# Patient Record
Sex: Female | Born: 1955 | Race: Black or African American | Hispanic: No | Marital: Married | Smoking: Current every day smoker
Health system: Southern US, Community
[De-identification: ages and names within clinical notes are randomized; demographics above are authoritative.]

## PROBLEM LIST (undated history)

## (undated) DIAGNOSIS — E119 Type 2 diabetes mellitus without complications: Secondary | ICD-10-CM

## (undated) DIAGNOSIS — M353 Polymyalgia rheumatica: Secondary | ICD-10-CM

## (undated) DIAGNOSIS — I10 Essential (primary) hypertension: Secondary | ICD-10-CM

## (undated) DIAGNOSIS — E7800 Pure hypercholesterolemia, unspecified: Secondary | ICD-10-CM

## (undated) HISTORY — PX: THYROIDECTOMY, PARTIAL: SHX18

---

## 2017-03-17 ENCOUNTER — Emergency Department: Payer: Self-pay

## 2017-03-17 ENCOUNTER — Encounter: Payer: Self-pay | Admitting: Emergency Medicine

## 2017-03-17 ENCOUNTER — Emergency Department
Admission: EM | Admit: 2017-03-17 | Discharge: 2017-03-17 | Disposition: A | Discharge status: Home / Self Care | Payer: Self-pay | Attending: Emergency Medicine | Admitting: Emergency Medicine

## 2017-03-17 ENCOUNTER — Other Ambulatory Visit: Payer: Self-pay

## 2017-03-17 DIAGNOSIS — R079 Chest pain, unspecified: Secondary | ICD-10-CM | POA: Insufficient documentation

## 2017-03-17 DIAGNOSIS — Z5321 Procedure and treatment not carried out due to patient leaving prior to being seen by health care provider: Secondary | ICD-10-CM | POA: Insufficient documentation

## 2017-03-17 HISTORY — DX: Essential (primary) hypertension: I10

## 2017-03-17 HISTORY — DX: Polymyalgia rheumatica: M35.3

## 2017-03-17 HISTORY — DX: Pure hypercholesterolemia, unspecified: E78.00

## 2017-03-17 HISTORY — DX: Type 2 diabetes mellitus without complications: E11.9

## 2017-03-17 LAB — BASIC METABOLIC PANEL
Anion gap: 10 (ref 5–15)
BUN: 18 mg/dL (ref 6–20)
CALCIUM: 8.9 mg/dL (ref 8.9–10.3)
CHLORIDE: 103 mmol/L (ref 101–111)
CO2: 25 mmol/L (ref 22–32)
CREATININE: 0.77 mg/dL (ref 0.44–1.00)
GFR calc non Af Amer: >60 mL/min (ref >60)
GLUCOSE: 183 mg/dL — AB (ref 65–99)
Potassium: 3.2 mmol/L — ABNORMAL LOW (ref 3.5–5.1)
Sodium: 138 mmol/L (ref 135–145)

## 2017-03-17 LAB — CBC
HCT: 40.5 % (ref 35.0–47.0)
HEMOGLOBIN: 13.6 g/dL (ref 12.0–16.0)
MCH: 31.3 pg (ref 26.0–34.0)
MCHC: 33.5 g/dL (ref 32.0–36.0)
MCV: 93.2 fL (ref 80.0–100.0)
PLATELETS: 381 10*3/uL (ref 150–440)
RBC: 4.34 MIL/uL (ref 3.80–5.20)
RDW: 15.4 % — ABNORMAL HIGH (ref 11.5–14.5)
WBC: 14.8 10*3/uL — ABNORMAL HIGH (ref 3.6–11.0)

## 2017-03-17 LAB — TROPONIN I: Troponin I: <0.03 ng/mL (ref <0.03)

## 2017-03-17 NOTE — ED Triage Notes (Signed)
Pt c/o intermittent right sided sharp chest pains that started today. Some intermittent SHOB as well. No cough. No fevers. No vomiting. No respiratory distress noted.

## 2017-03-21 ENCOUNTER — Telehealth: Payer: Self-pay | Admitting: Emergency Medicine

## 2017-03-21 NOTE — Telephone Encounter (Signed)
Called patient due to lwot to inquire about condition and follow up plans. Left message.   

## 2018-07-02 IMAGING — CR DG CHEST 2V
2 series · 2 of 2 positions shown · non-contrast
Comparison: None.

CLINICAL DATA: Intermittent right-sided chest pain. History of
hypertension and diabetes.

EXAM:
CHEST  2 VIEW

[chest pa]
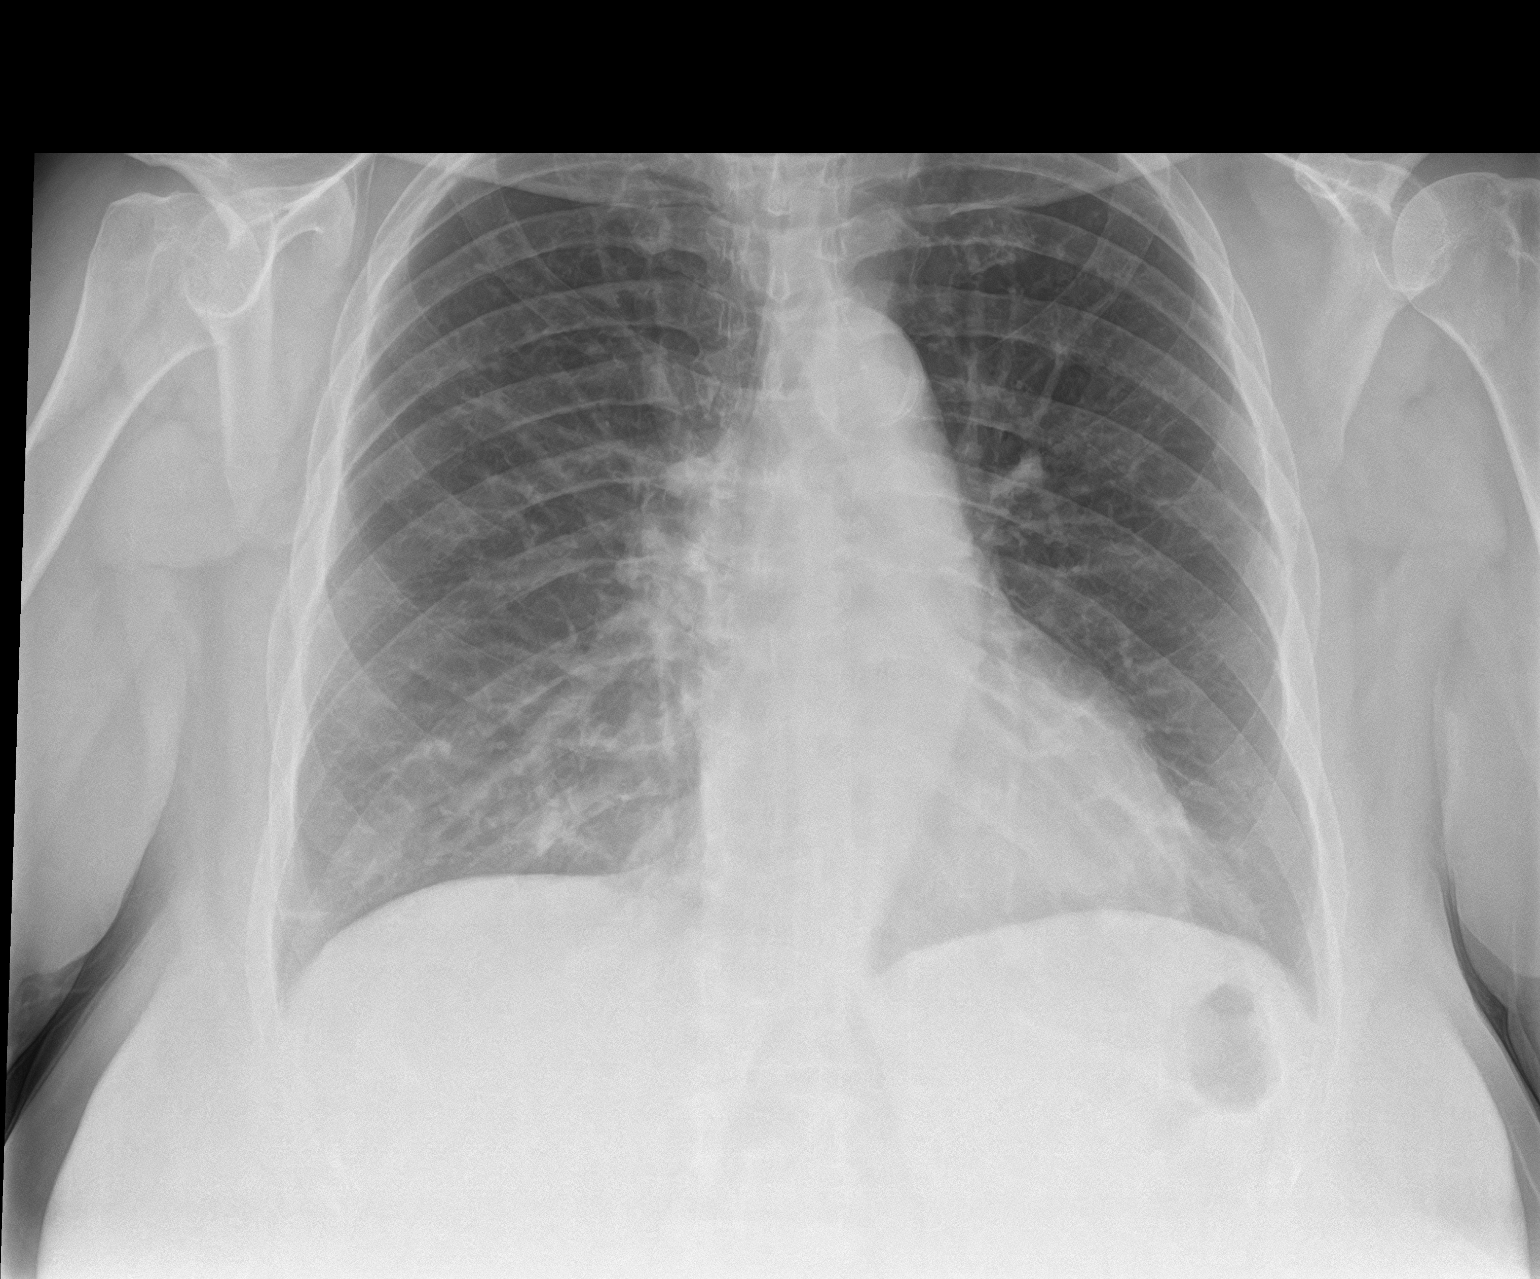

[chest lat]
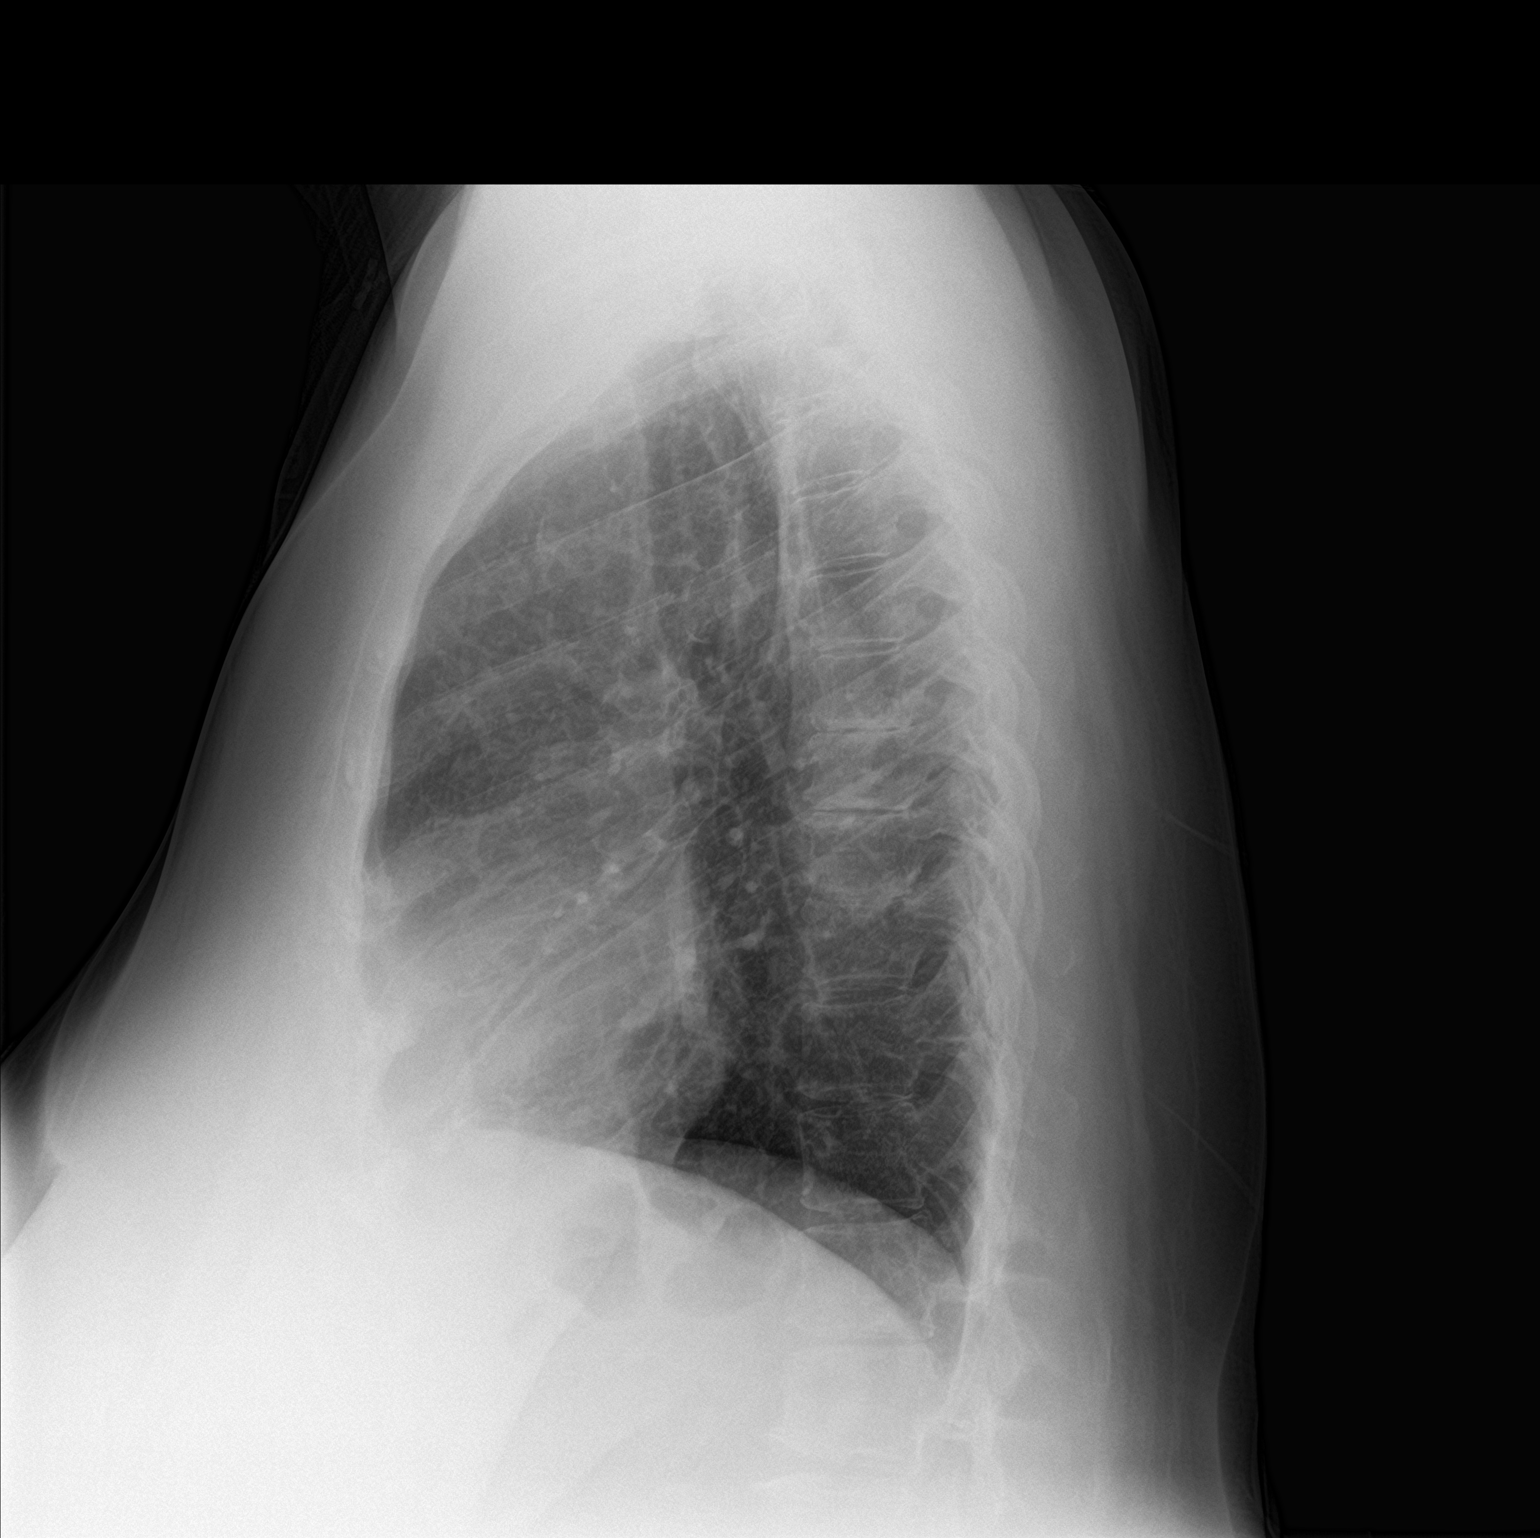

[2 of 2 positions shown; findings below may reference images not displayed]

FINDINGS: The heart size and mediastinal contours are normal. There is mild
aortic atherosclerosis. The lungs are clear. There is no pleural
effusion or pneumothorax. No acute osseous findings are evident.
There are mild degenerative changes throughout the thoracic spine.
IMPRESSION: No active cardiopulmonary process.  Aortic atherosclerosis.

## 2021-08-14 ENCOUNTER — Encounter: Payer: Self-pay | Admitting: Podiatry

## 2021-08-14 ENCOUNTER — Ambulatory Visit (INDEPENDENT_AMBULATORY_CARE_PROVIDER_SITE_OTHER): Payer: Medicare Other | Admitting: Podiatry

## 2021-08-14 ENCOUNTER — Ambulatory Visit (INDEPENDENT_AMBULATORY_CARE_PROVIDER_SITE_OTHER): Payer: Medicare Other

## 2021-08-14 DIAGNOSIS — E0843 Diabetes mellitus due to underlying condition with diabetic autonomic (poly)neuropathy: Secondary | ICD-10-CM

## 2021-08-14 DIAGNOSIS — M2142 Flat foot [pes planus] (acquired), left foot: Secondary | ICD-10-CM

## 2021-08-14 DIAGNOSIS — M7752 Other enthesopathy of left foot: Secondary | ICD-10-CM

## 2021-08-14 DIAGNOSIS — M2141 Flat foot [pes planus] (acquired), right foot: Secondary | ICD-10-CM

## 2021-08-14 DIAGNOSIS — M779 Enthesopathy, unspecified: Secondary | ICD-10-CM

## 2021-08-14 MED ORDER — BETAMETHASONE SOD PHOS & ACET 6 (3-3) MG/ML IJ SUSP: 3.0000 mg | Freq: Once | INTRAMUSCULAR | Status: AC

## 2021-08-14 NOTE — Progress Notes (Signed)
   Subjective:  66 y.o. female presenting today for evaluation of left foot and ankle pain has been going on for about 1-2 years now.  Progressive onset.  Patient states that she has began developing increased pain and tenderness to the left foot and ankle.  She is a diabetic on insulin.  She presents for further treatment and evaluation   Past Medical History:  Diagnosis Date   Diabetes mellitus without complication (HCC)    Hypercholesteremia    Hypertension    PMR (polymyalgia rheumatica) (HCC)    Past Surgical History:  Procedure Laterality Date   THYROIDECTOMY, PARTIAL     No Known Allergies     Objective/Physical Exam General: The patient is alert and oriented x3 in no acute distress.  Dermatology: Skin is warm, dry and supple bilateral lower extremities. Negative for open lesions or macerations.  Vascular: Palpable pedal pulses bilaterally. No edema or erythema noted. Capillary refill within normal limits.  Neurological: Epicritic and protective threshold diminished bilaterally.   Musculoskeletal Exam: Range of motion within normal limits to all pedal and ankle joints bilateral. Muscle strength 5/5 in all groups bilateral.  Upon weightbearing there is a medial longitudinal arch collapse bilaterally. Remove foot valgus noted to the bilateral lower extremities with excessive pronation upon mid stance.  There is also some pain on palpation to the lateral aspect of the left ankle joint  Radiographic Exam:  Degenerative changes noted throughout the joints of the foot and ankle. Joint spaces preserved. No fracture/dislocation/boney destruction.   Pes planus noted on radiographic exam lateral views. Decreased calcaneal inclination and metatarsal declination angle is noted. Anterior break in the cyma line noted on lateral views. Medial talar head to deviation noted on AP radiograph.   Assessment: 1.  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  Capsulitis left ankle   Plan of Care:  1. Patient was evaluated. X-Rays reviewed.  Comprehensive diabetic foot exam performed today 2.  Injection of 0.5 cc Celestone Soluspan injected in the lateral aspect of the left ankle joint.  Patient felt significant relief 3.  Appointment with Pedorthist for custom molded diabetic insoles and shoes 4.  Return to clinic annually   Felecia Shelling, DPM [facility]  Dr. Felecia Shelling, DPM    [address]                                        [place], [place] [address]                Office [phone]  Fax [phone]

## 2021-08-28 ENCOUNTER — Ambulatory Visit: Payer: Medicare Other

## 2021-08-28 DIAGNOSIS — M2141 Flat foot [pes planus] (acquired), right foot: Secondary | ICD-10-CM

## 2021-08-28 DIAGNOSIS — E0843 Diabetes mellitus due to underlying condition with diabetic autonomic (poly)neuropathy: Secondary | ICD-10-CM

## 2021-08-28 NOTE — Progress Notes (Signed)
SITUATION Reason for Consult: Evaluation for Prefabricated Diabetic Shoes and Custom Diabetic Inserts. Patient / Caregiver Report: Patient would like well fitting shoes  OBJECTIVE DATA: Patient History / Diagnosis:    ICD-10-CM   1. Diabetes mellitus due to underlying condition with diabetic autonomic neuropathy, unspecified whether long term insulin use (HCC)  E08.43     2. Pes planus of both feet  M21.41    M21.42       Physician Treating Diabetes:  Cristal Ford, MD  Current or Previous Devices:   None and no history  In-Person Foot Examination: Ulcers & Callousing:   None Deformities:    Pes Planus Sensation:    Intact  Shoe Size:     9W  ORTHOTIC RECOMMENDATION Recommended Devices: - 1x pair prefabricated PDAC approved diabetic shoes; Patient Selected Apex V952W velcro white Size 9W - 3x pair custom-to-patient PDAC approved vacuum formed diabetic insoles.  GOALS OF SHOES AND INSOLES - Reduce shear and pressure - Reduce / Prevent callus formation - Reduce / Prevent ulceration - Protect the fragile healing compromised diabetic foot.  Patient would benefit from diabetic shoes and inserts as patient has diabetes mellitus and the patient has one or more of the following conditions: - History of partial or complete amputation of the foot - History of previous foot ulceration. - History of pre-ulcerative callus - Peripheral neuropathy with evidence of callus formation - Foot deformity - Poor circulation  ACTIONS PERFORMED Potential out of pocket cost was communicated to patient. Patient understood and consented to measurement and casting. Patient was casted for insoles via crush box and measured for shoes via brannock device. Procedure was explained and patient tolerated procedure well. All questions were answered and concerns addressed. Casts were shipped to central fabrication for HOLD until Certificate of Medical Necessity or otherwise necessary authorization from  insurance is obtained.  PLAN Shoes are to be ordered and casts released from hold once all appropriate paperwork is complete. Patient is to be contacted and scheduled for fitting once shoes and insoles have been fabricated and received.

## 2021-09-04 ENCOUNTER — Ambulatory Visit
Admission: EM | Admit: 2021-09-04 | Discharge: 2021-09-04 | Disposition: A | Discharge status: Home / Self Care | Payer: Medicare Other | Attending: Internal Medicine | Admitting: Internal Medicine

## 2021-09-04 DIAGNOSIS — G5602 Carpal tunnel syndrome, left upper limb: Secondary | ICD-10-CM

## 2021-09-04 MED ORDER — NAPROXEN 375 MG PO TABS: 375.0000 mg | ORAL_TABLET | Freq: Two times a day (BID) | ORAL | 0 refills | Status: DC

## 2021-09-04 NOTE — Discharge Instructions (Signed)
Please wear your wrist brace Rest and elevate the affected painful area.   Apply cold compresses intermittently as needed.   As pain recedes, begin normal activities slowly as tolerated.   Return to urgent care if symptoms worsen

## 2021-09-04 NOTE — ED Triage Notes (Signed)
Patient presents to [facility] for swelling and pain left in her hand.  Patient states she does not recall any injury to that hand.

## 2021-09-08 ENCOUNTER — Ambulatory Visit
Admission: EM | Admit: 2021-09-08 | Discharge: 2021-09-08 | Disposition: A | Discharge status: Home / Self Care | Payer: Medicare Other | Attending: Emergency Medicine | Admitting: Emergency Medicine

## 2021-09-08 DIAGNOSIS — G5602 Carpal tunnel syndrome, left upper limb: Secondary | ICD-10-CM | POA: Diagnosis not present

## 2021-09-08 MED ORDER — PREDNISONE 10 MG (21) PO TBPK: ORAL_TABLET | ORAL | 0 refills | Status: AC

## 2021-09-08 NOTE — ED Provider Notes (Signed)
[facility]    CSN: [id number] Arrival date & time: 09/04/21  1512      History   Chief Complaint Chief Complaint  Patient presents with   Hand Injury    left    HPI Julie Young is a 66 y.o. female comes to urgent care with pain in the left wrist and hand.  Pain started several days ago.  Pain is throbbing and moderate severe.  Patient denies any trauma.  She started working out a few weeks ago and uses weights with repetitive wrist motion.  She has a history of carpal tunnel syndrome involving the right wrist.  That has improved.  No swelling of the left wrist.  She has some numbness and tingling in the left index and middle fingers.  No bruising.  HPI  Past Medical History:  Diagnosis Date   Diabetes mellitus without complication (HCC)    Hypercholesteremia    Hypertension    PMR (polymyalgia rheumatica) (HCC)     There are no problems to display for this patient.   Past Surgical History:  Procedure Laterality Date   THYROIDECTOMY, PARTIAL      OB History   No obstetric history on file.      Home Medications    Prior to Admission medications   Medication Sig Start Date End Date Taking? Authorizing Provider  naproxen (NAPROSYN) 375 MG tablet Take 1 tablet (375 mg total) by mouth 2 (two) times daily for 14 days. 09/04/21 09/18/21 Yes Murtaza Shell, Britta Mccreedy, MD  albuterol (VENTOLIN HFA) 108 (90 Base) MCG/ACT inhaler SMARTSIG:2 inhalation Via Inhaler Every 6 Hours PRN 04/03/21   [provider]  amLODipine (NORVASC) 10 MG tablet Take 10 mg by mouth daily. 07/27/21   [provider]  aspirin EC 81 MG tablet Take by mouth.    [provider]  atorvastatin (LIPITOR) 40 MG tablet Take 40 mg by mouth daily. 08/17/21   [provider]  hydrochlorothiazide (HYDRODIURIL) 25 MG tablet Take 25 mg by mouth daily. 08/12/21   [provider]  Insulin Glargine (BASAGLAR KWIKPEN) 100 UNIT/ML Inject 20 Units into the skin at bedtime.  07/12/21   [provider]  lisinopril (ZESTRIL) 40 MG tablet Take 40 mg by mouth daily. 08/12/21   [provider]  metFORMIN (GLUCOPHAGE-XR) 500 MG 24 hr tablet SMARTSIG:2 Tablet(s) By Mouth Every Evening 08/12/21   [provider]  predniSONE (DELTASONE) 5 MG tablet prednisone 5 mg tablet    [provider]    Family History History reviewed. No pertinent family history.  Social History Social History   Tobacco Use   Smoking status: Every Day    Packs/day: 0.50    Types: Cigarettes   Smokeless tobacco: Never  Substance Use Topics   Alcohol use: Yes    Comment: 1-2 glasses wine per night   Drug use: No     Allergies   Patient has no known allergies.   Review of Systems Review of Systems  Gastrointestinal: Negative.   Musculoskeletal:  Positive for arthralgias. Negative for myalgias, neck pain and neck stiffness.  Skin: Negative.   Neurological: Negative.      Physical Exam Triage Vital Signs ED Triage Vitals  Enc Vitals Group     BP 09/04/21 1529 126/75     Pulse Rate 09/04/21 1529 (!) 111     Resp 09/04/21 1529 20     Temp 09/04/21 1529 98.3 F (36.8 C)     Temp Source 09/04/21 1529  Oral     SpO2 09/04/21 1529 98 %     Weight 09/04/21 1526 185 lb (83.9 kg)     Height 09/04/21 1526 5\' 5"  (1.651 m)     Head Circumference --      Peak Flow --      Pain Score 09/04/21 1526 10     Pain Loc --      Pain Edu? --      Excl. in GC? --    No data found.  Updated Vital Signs BP 126/75 (BP Location: Right Arm)   Pulse (!) 111   Temp 98.3 F (36.8 C) (Oral)   Resp 20   Ht 5\' 5"  (1.651 m)   Wt 83.9 kg   SpO2 98%   BMI 30.79 kg/m   Visual Acuity Right Eye Distance:   Left Eye Distance:   Bilateral Distance:    Right Eye Near:   Left Eye Near:    Bilateral Near:     Physical Exam Vitals and nursing note reviewed.  Constitutional:      General: She is not in acute distress.    Appearance: She is not ill-appearing.   Cardiovascular:     Rate and Rhythm: Normal rate and regular rhythm.  Musculoskeletal:        General: Tenderness present. No deformity or signs of injury. Normal range of motion.     Right lower leg: No edema.     Left lower leg: No edema.  Skin:    General: Skin is warm.  Neurological:     Mental Status: She is alert.      UC Treatments / Results  Labs (all labs ordered are listed, but only abnormal results are displayed) Labs Reviewed - No data to display  EKG   Radiology No results found.  Procedures Procedures (including critical care time)  Medications Ordered in UC Medications - No data to display  Initial Impression / Assessment and Plan / UC Course  I have reviewed the triage vital signs and the nursing notes.  Pertinent labs & imaging results that were available during my care of the patient were reviewed by me and considered in my medical decision making (see chart for details).     1.  Carpal tunnel syndrome of the left wrist: Naproxen 375 mg twice daily as needed for pain Wrist brace Cold compress Gentle range of motion exercises Return to urgent care if symptoms worsen No indication for imaging at this time.  Final Clinical Impressions(s) / UC Diagnoses   Final diagnoses:  Carpal tunnel syndrome of left wrist     Discharge Instructions      Please wear your wrist brace Rest and elevate the affected painful area.   Apply cold compresses intermittently as needed.   As pain recedes, begin normal activities slowly as tolerated.   Return to urgent care if symptoms worsen   ED Prescriptions     Medication Sig Dispense Auth. Provider   naproxen (NAPROSYN) 375 MG tablet Take 1 tablet (375 mg total) by mouth 2 (two) times daily for 14 days. 28 tablet Mylan Lengyel, 11/04/21, MD      PDMP not reviewed this encounter.   , MD 09/08/21 [phone]

## 2021-09-08 NOTE — Discharge Instructions (Signed)
Continue to wear your wrist brace to help decrease inflammation in yoru left wrist.  Start the Prednisone tomorrow morning and take it each morning at breakfast.  If the pain continues you will need to see orthopedics.

## 2021-09-08 NOTE — ED Triage Notes (Signed)
Pt is here for follow-up on her left hand pain. She reports the pain is getting worse and she would like a shot to help with pain.

## 2021-09-08 NOTE — ED Provider Notes (Signed)
[facility]    CSN: [id number] Arrival date & time: 09/08/21  1657      History   Chief Complaint Chief Complaint  Patient presents with   Hand Pain    HPI Julie Young is a 66 y.o. female.   HPI  66 year old female here for reevaluation of left wrist pain.  Patient reports that she was evaluated 4 days ago in this urgent care and diagnosed with carpal tunnel syndrome in her left wrist.  She was prescribed Aleve and states that that has been helping but has not alleviated the pain.  She returned today in hopes of getting a steroid injection in her wrist.  She states she has been wearing the wrist brace but she has not been keeping her left wrist elevated or applying ice to her wrist to help decrease inflammation.  She does have some numbness and tingling in her fingers that comes and goes.  Past Medical History:  Diagnosis Date   Diabetes mellitus without complication (HCC)    Hypercholesteremia    Hypertension    PMR (polymyalgia rheumatica) (HCC)     There are no problems to display for this patient.   Past Surgical History:  Procedure Laterality Date   THYROIDECTOMY, PARTIAL      OB History   No obstetric history on file.      Home Medications    Prior to Admission medications   Medication Sig Start Date End Date Taking? Authorizing Provider  predniSONE (STERAPRED UNI-PAK 21 TAB) 10 MG (21) TBPK tablet Take 6 tablets on day 1, 5 tablets day 2, 4 tablets day 3, 3 tablets day 4, 2 tablets day 5, 1 tablet day 6 09/08/21  Yes Becky Augusta, NP  albuterol (VENTOLIN HFA) 108 (90 Base) MCG/ACT inhaler SMARTSIG:2 inhalation Via Inhaler Every 6 Hours PRN 04/03/21   [provider]  amLODipine (NORVASC) 10 MG tablet Take 10 mg by mouth daily. 07/27/21   [provider]  aspirin EC 81 MG tablet Take by mouth.    [provider]  atorvastatin (LIPITOR) 40 MG tablet Take 40 mg by mouth daily. 08/17/21   [provider]   hydrochlorothiazide (HYDRODIURIL) 25 MG tablet Take 25 mg by mouth daily. 08/12/21   [provider]  Insulin Glargine (BASAGLAR KWIKPEN) 100 UNIT/ML Inject 20 Units into the skin at bedtime. 07/12/21   [provider]  lisinopril (ZESTRIL) 40 MG tablet Take 40 mg by mouth daily. 08/12/21   [provider]  metFORMIN (GLUCOPHAGE-XR) 500 MG 24 hr tablet SMARTSIG:2 Tablet(s) By Mouth Every Evening 08/12/21   [provider]  predniSONE (DELTASONE) 5 MG tablet prednisone 5 mg tablet    [provider]    Family History History reviewed. No pertinent family history.  Social History Social History   Tobacco Use   Smoking status: Every Day    Packs/day: 0.50    Types: Cigarettes   Smokeless tobacco: Never  Substance Use Topics   Alcohol use: Yes    Comment: 1-2 glasses wine per night   Drug use: No     Allergies   Patient has no known allergies.   Review of Systems Review of Systems  Musculoskeletal:  Positive for arthralgias and joint swelling.  Neurological:  Positive for numbness.  Hematological: Negative.   Psychiatric/Behavioral: Negative.       Physical Exam Triage Vital Signs ED Triage Vitals  Enc Vitals Group     BP      Pulse  Resp      Temp      Temp src      SpO2      Weight      Height      Head Circumference      Peak Flow      Pain Score      Pain Loc      Pain Edu?      Excl. in GC?    No data found.  Updated Vital Signs BP (!) 148/92 (BP Location: Left Arm)   Pulse (!) 110   Temp 98.7 F (37.1 C) (Oral)   Resp 16   SpO2 100%   Visual Acuity Right Eye Distance:   Left Eye Distance:   Bilateral Distance:    Right Eye Near:   Left Eye Near:    Bilateral Near:     Physical Exam Vitals and nursing note reviewed.  Constitutional:      Appearance: Normal appearance. She is not ill-appearing.  HENT:     Head: Normocephalic and atraumatic.  Musculoskeletal:        General: Swelling and  tenderness present. No deformity or signs of injury. Normal range of motion.  Skin:    General: Skin is warm and dry.     Capillary Refill: Capillary refill takes less than 2 seconds.     Findings: No bruising or erythema.  Neurological:     General: No focal deficit present.     Mental Status: She is alert and oriented to person, place, and time.  Psychiatric:        Mood and Affect: Mood normal.        Behavior: Behavior normal.        Thought Content: Thought content normal.        Judgment: Judgment normal.      UC Treatments / Results  Labs (all labs ordered are listed, but only abnormal results are displayed) Labs Reviewed - No data to display  EKG   Radiology No results found.  Procedures Procedures (including critical care time)  Medications Ordered in UC Medications - No data to display  Initial Impression / Assessment and Plan / UC Course  I have reviewed the triage vital signs and the nursing notes.  Pertinent labs & imaging results that were available during my care of the patient were reviewed by me and considered in my medical decision making (see chart for details).  Patient is a nontoxic-appearing 66 old 70female here for reevaluation of left wrist pain and carpal tunnel as outlined HPI above.  As stated above, patient has returned in hopes of getting a steroid injection in her left wrist.  She is going to [place] at the end of the week and she wants to be able to not wear her wrist brace and have the pain and inflammation in her left wrist resolved by then.  I have advised the patient that we do not do targeted steroid injections in joints here in the urgent care.  We can give her a shot of Decadron or I can give her a prednisone taper that she can take but if she needs a targeted steroid injection she will need to see orthopedics.  She has some mild swelling to the fingers of her left hand but she has full range of motion and sensation.  There is no  discoloration.  Cap refills less than 2 seconds.  I have advised her to keep wearing her wrist splint at all  times.  She should take her wrist and off 1 or 2 times a day to apply ice to her wrist to help decrease inflammation.  I have also encouraged her to keep her left wrist elevated is much as possible to help decrease inflammation.  I will prescribe her a prednisone taper that she can start tomorrow.  I have advised her to not take the Aleve that she was previously prescribed while she takes the prednisone.  I have also advised her that the prednisone is a temporary fix and that if her pain comes back she should follow-up with orthopedics.   Final Clinical Impressions(s) / UC Diagnoses   Final diagnoses:  Carpal tunnel syndrome of left wrist     Discharge Instructions      Continue to wear your wrist brace to help decrease inflammation in yoru left wrist.  Start the Prednisone tomorrow morning and take it each morning at breakfast.  If the pain continues you will need to see orthopedics.      ED Prescriptions     Medication Sig Dispense Auth. Provider   predniSONE (STERAPRED UNI-PAK 21 TAB) 10 MG (21) TBPK tablet Take 6 tablets on day 1, 5 tablets day 2, 4 tablets day 3, 3 tablets day 4, 2 tablets day 5, 1 tablet day 6 21 tablet Becky Augusta, NP      PDMP not reviewed this encounter.   Becky Augusta, NP 09/08/21 [phone]

## 2021-10-22 ENCOUNTER — Telehealth: Payer: Self-pay | Admitting: Podiatry

## 2021-10-22 NOTE — Telephone Encounter (Signed)
Patient is calling about the status of her diabetic shoes

## 2021-11-13 ENCOUNTER — Telehealth: Payer: Self-pay | Admitting: Podiatry

## 2021-11-13 NOTE — Telephone Encounter (Signed)
Spoke with the patient this morning, telephone encounter created for call.

## 2021-11-13 NOTE — Telephone Encounter (Signed)
Pt called upset that she has left many messages on the voicemail for diabetic shoes for over a month and no one has called her back. She also stated someone told her they would send a message and she would get a call back and did not.  I did apologize and explain that the gentleman that she seen is no longer with our practice and we are trying to get the dept in order again.  Upon checking safestep for her documents they were sent to the wrong fax number for her pcp that is in [place], they were sent to [place]. I told pt that I would print them out now and get them sent to the correct fax number and she decided she just wanted to cancel the order she will go somewhere else to get her diabetic shoes. She stated she was going to call the insurance company to make sure we did not charge her for the shoes and I explained that it is a medicare policy that we cannot bill for the shoes/ inserts until pt picks them up and I did check to make sure the charges were not in the chart already.

## 2021-11-13 NOTE — Telephone Encounter (Addendum)
Returned call from voicemail about diabetic shoe order. Pt stated that she called in this morning and spoke with another representative from our office and the paperwork is to be resubmitted to her PCP. Patient stated that the original form was sent to the wrong fax number(which is done through [facility]). Apologized in the delayed response from her voicemail and I assured her that I would try to make it right. Asked pt to call back in two weeks if she had not heard from me and she stated she would. I will be postponing this message for 7 days and following up on it to recheck if documentation has come back. Pt was satisfied and was appreciative of my efforts to correct the process.

## 2021-11-20 ENCOUNTER — Telehealth: Payer: Self-pay | Admitting: Podiatry

## 2021-11-20 NOTE — Telephone Encounter (Signed)
Left message for patient to advise that we have not received paper work from Dr. Vilinda Flake will resend [facility]

## 2021-11-25 ENCOUNTER — Encounter: Payer: Self-pay | Admitting: Podiatry

## 2022-05-06 ENCOUNTER — Emergency Department
Admission: EM | Admit: 2022-05-06 | Discharge: 2022-05-06 | Disposition: A | Discharge status: Home / Self Care | Payer: Medicare Other | Attending: Emergency Medicine | Admitting: Emergency Medicine

## 2022-05-06 ENCOUNTER — Other Ambulatory Visit: Payer: Self-pay

## 2022-05-06 ENCOUNTER — Emergency Department: Payer: Medicare Other

## 2022-05-06 DIAGNOSIS — I10 Essential (primary) hypertension: Secondary | ICD-10-CM | POA: Insufficient documentation

## 2022-05-06 DIAGNOSIS — R42 Dizziness and giddiness: Secondary | ICD-10-CM | POA: Insufficient documentation

## 2022-05-06 DIAGNOSIS — E119 Type 2 diabetes mellitus without complications: Secondary | ICD-10-CM | POA: Insufficient documentation

## 2022-05-06 LAB — URINALYSIS, ROUTINE W REFLEX MICROSCOPIC
Bilirubin Urine: NEGATIVE
Glucose, UA: NEGATIVE mg/dL
Hgb urine dipstick: NEGATIVE
Ketones, ur: NEGATIVE mg/dL
Nitrite: NEGATIVE
Protein, ur: NEGATIVE mg/dL
Specific Gravity, Urine: 1.005 (ref 1.005–1.030)
pH: 5 (ref 5.0–8.0)

## 2022-05-06 LAB — CBC
HCT: 39.7 % (ref 36.0–46.0)
Hemoglobin: 12.5 g/dL (ref 12.0–15.0)
MCH: 29.9 pg (ref 26.0–34.0)
MCHC: 31.5 g/dL (ref 30.0–36.0)
MCV: 95 fL (ref 80.0–100.0)
Platelets: 529 10*3/uL — ABNORMAL HIGH (ref 150–400)
RBC: 4.18 MIL/uL (ref 3.87–5.11)
RDW: 13.8 % (ref 11.5–15.5)
WBC: 14.6 10*3/uL — ABNORMAL HIGH (ref 4.0–10.5)
nRBC: 0 % (ref 0.0–0.2)

## 2022-05-06 LAB — CBG MONITORING, ED: Glucose-Capillary: 176 mg/dL — ABNORMAL HIGH (ref 70–99)

## 2022-05-06 LAB — BASIC METABOLIC PANEL
Anion gap: 10 (ref 5–15)
BUN: 23 mg/dL (ref 8–23)
CO2: 25 mmol/L (ref 22–32)
Calcium: 9 mg/dL (ref 8.9–10.3)
Chloride: 101 mmol/L (ref 98–111)
Creatinine, Ser: 0.98 mg/dL (ref 0.44–1.00)
GFR, Estimated: >60 mL/min (ref >60)
Glucose, Bld: 178 mg/dL — ABNORMAL HIGH (ref 70–99)
Potassium: 3.9 mmol/L (ref 3.5–5.1)
Sodium: 136 mmol/L (ref 135–145)

## 2022-05-06 MED ORDER — MECLIZINE HCL 25 MG PO TABS: 25.0000 mg | ORAL_TABLET | Freq: Three times a day (TID) | ORAL | 0 refills | Status: AC | PRN

## 2022-05-06 MED ORDER — MECLIZINE HCL 25 MG PO TABS
25.0000 mg | ORAL_TABLET | Freq: Once | ORAL | Status: AC
Start: 2022-05-06 — End: 2022-05-06
  Administered 2022-05-06: 25 mg via ORAL
  Filled 2022-05-06: qty 1

## 2022-05-06 NOTE — ED Notes (Signed)
Cbg [id number]

## 2022-05-06 NOTE — ED Triage Notes (Signed)
Pt with c/o dizziness and headaches x 2 weeks ago. Pt states she saw her PCP on Monday and told them but was not given any treatment. Pt states symptoms are getting worse. Pt denies N/V.

## 2022-05-06 NOTE — Discharge Instructions (Addendum)
Please try your meclizine medication every 8 hours as needed for dizziness.  Please drink plenty of fluids.  Follow-up with ENT if you continue to have symptoms by calling the number provided.  Return to the emergency department for any worsening symptoms or any symptoms personally concerning to yourself.

## 2022-05-06 NOTE — ED Provider Notes (Addendum)
[facility] Provider Note    Event Date/Time   First MD Initiated Contact with Patient 05/06/22 1601     (approximate)  History   Chief Complaint: Dizziness  HPI  Julie Young is a 67 y.o. female the past medical history of diabetes, hypertension, hyperlipidemia, polymyalgia rheumatica, presents to the emergency department for dizziness.  According to the patient for the past 2 weeks or so she has had a very constant sensation of being dizzy/off balance.  Patient went to her primary care doctor on Monday for the same but was not given any answers per patient and she feels that her symptoms are getting worse.  Patient denies any weakness or numbness confusion or headache.  Denies any chest pain abdominal pain denies any recent illnesses like fever cough congestion vomiting or diarrhea.  Physical Exam   Triage Vital Signs: ED Triage Vitals  Enc Vitals Group     BP 05/06/22 1509 (!) 155/87     Pulse Rate 05/06/22 1512 (!) 103     Resp 05/06/22 1509 18     Temp 05/06/22 1509 97.8 F (36.6 C)     Temp Source 05/06/22 1509 Oral     SpO2 05/06/22 1509 96 %     Weight 05/06/22 1512 180 lb (81.6 kg)     Height 05/06/22 1512 5\' 5"  (1.651 m)     Head Circumference --      Peak Flow --      Pain Score 05/06/22 1511 8     Pain Loc --      Pain Edu? --      Excl. in [place]? --     Most recent vital signs: Vitals:   05/06/22 1509 05/06/22 1512  BP: (!) 155/87   Pulse:  (!) 103  Resp: 18   Temp: 97.8 F (36.6 C)   SpO2: 96%     General: Awake, no distress.  CV:  Good peripheral perfusion.  Regular rate and rhythm  Resp:  Normal effort.  Equal breath sounds bilaterally.  Abd:  No distention.  Soft, nontender.  No rebound or guarding. Other:  Equal grip strength bilaterally, 5/5 motor in all extremities.  No obvious cranial nerve deficits.  No pronator drift.   ED Results / Procedures / Treatments   EKG  EKG viewed and interpreted by myself shows  sinus tachycardia 101 bpm with a narrow QRS, normal axis, normal intervals, no concerning ST changes  RADIOLOGY  Patient's MRI of the brain is negative for acute finding.   MEDICATIONS ORDERED IN ED: Medications  meclizine (ANTIVERT) tablet 25 mg (has no administration in time range)     IMPRESSION / MDM / ASSESSMENT AND PLAN / ED COURSE  I reviewed the triage vital signs and the nursing notes.  Patient's presentation is most consistent with acute presentation with potential threat to life or bodily function.  Patient presents emergency department for persistent dizziness over the past 2 weeks.  Denies any significant changes with position.  Has seen her doctor for the same but has no improvement.  Patient's lab work shows a reassuring chemistry with a normal renal function normal anion gap.  Slight leukocytosis but the patient is on chronic prednisone for her PMR.  Glucose slightly elevated but not significantly.  EKG shows mild tachycardia but no other concerning findings.  Given the patient's persistent dizziness although normal neurologic testing I do believe the patient would require an MRI to rule out CVA.  If MRI  is negative for CVA I believe a trial of meclizine and PCP follow-up would be appropriate.  Patient is agreeable to this plan as well.  Patient's workup is reassuring, MRI negative.  Given the patient's reassuring workup we will discharge with ENT follow-up start the patient on meclizine and have the patient see her doctor as well.  Patient agreeable to plan of care.  Urinalysis is normal.  Patient will be discharged home with ENT follow-up and meclizine trial.  Patient agreeable to plan.  FINAL CLINICAL IMPRESSION(S) / ED DIAGNOSES   Dizziness  Rx / DC Orders   Meclizine  Note:  This document was prepared using Dragon voice recognition software and may include unintentional dictation errors.   Harvest Dark, MD 05/06/22 1750    Harvest Dark,  MD 05/06/22 [phone]

## 2022-07-26 ENCOUNTER — Encounter: Payer: Self-pay | Admitting: Otolaryngology

## 2023-02-18 ENCOUNTER — Emergency Department: Payer: Medicare Other

## 2023-02-18 ENCOUNTER — Other Ambulatory Visit: Payer: Self-pay

## 2023-02-18 ENCOUNTER — Emergency Department
Admission: EM | Admit: 2023-02-18 | Discharge: 2023-02-18 | Disposition: A | Discharge status: Home / Self Care | Payer: Medicare Other | Attending: Student in an Organized Health Care Education/Training Program | Admitting: Student in an Organized Health Care Education/Training Program

## 2023-02-18 DIAGNOSIS — I10 Essential (primary) hypertension: Secondary | ICD-10-CM | POA: Diagnosis not present

## 2023-02-18 DIAGNOSIS — F419 Anxiety disorder, unspecified: Secondary | ICD-10-CM | POA: Insufficient documentation

## 2023-02-18 DIAGNOSIS — E119 Type 2 diabetes mellitus without complications: Secondary | ICD-10-CM | POA: Diagnosis not present

## 2023-02-18 DIAGNOSIS — R0789 Other chest pain: Secondary | ICD-10-CM | POA: Diagnosis not present

## 2023-02-18 DIAGNOSIS — R002 Palpitations: Secondary | ICD-10-CM | POA: Diagnosis present

## 2023-02-18 LAB — TSH: TSH: 1.768 u[IU]/mL (ref 0.350–4.500)

## 2023-02-18 LAB — TROPONIN I (HIGH SENSITIVITY)
Troponin I (High Sensitivity): 11 ng/L (ref <18)
Troponin I (High Sensitivity): 12 ng/L (ref <18)

## 2023-02-18 LAB — BASIC METABOLIC PANEL
Anion gap: 12 (ref 5–15)
BUN: 33 mg/dL — ABNORMAL HIGH (ref 8–23)
CO2: 23 mmol/L (ref 22–32)
Calcium: 9.2 mg/dL (ref 8.9–10.3)
Chloride: 98 mmol/L (ref 98–111)
Creatinine, Ser: 1.16 mg/dL — ABNORMAL HIGH (ref 0.44–1.00)
GFR, Estimated: 52 mL/min — ABNORMAL LOW (ref >60)
Glucose, Bld: 184 mg/dL — ABNORMAL HIGH (ref 70–99)
Potassium: 3.5 mmol/L (ref 3.5–5.1)
Sodium: 133 mmol/L — ABNORMAL LOW (ref 135–145)

## 2023-02-18 LAB — D-DIMER, QUANTITATIVE: D-Dimer, Quant: 1.28 ug{FEU}/mL — ABNORMAL HIGH (ref 0.00–0.50)

## 2023-02-18 MED ORDER — IOHEXOL 350 MG/ML SOLN
75.0000 mL | Freq: Once | INTRAVENOUS | Status: AC | PRN
  Administered 2023-02-18: 75 mL via INTRAVENOUS

## 2023-02-18 MED ORDER — HYDROXYZINE HCL 10 MG PO TABS: 10.0000 mg | ORAL_TABLET | Freq: Three times a day (TID) | ORAL | 0 refills | Status: AC | PRN

## 2023-02-18 NOTE — ED Triage Notes (Signed)
Pt here cp and dizziness since yesterday. Pt states she has some left side cp that has been constant. Pt denies radiation. Pt also states she has been dizzy since yesterday as well. Pt denies NVD. Pt has a hx of DM and htn.

## 2023-02-18 NOTE — ED Provider Notes (Addendum)
[facility] Provider Note    Event Date/Time   First MD Initiated Contact with Patient 02/18/23 [phone]     (approximate)   History   Chest Pain   HPI  Julie Young is a 67 y.o. female history of diabetes hypertension presents the ER for feelings of anxiety and palpitations over the past few days.  Feels lightheaded.  No numbness or tingling.  States that she is dealing with quite a bit of stress at home and suspect that this is contributing to her presentation but wanted to be evaluated.  She denies any pain no nausea or vomiting.  No new medications.     Physical Exam   Triage Vital Signs: ED Triage Vitals  Encounter Vitals Group     BP 02/18/23 0943 (!) 145/86     Systolic BP Percentile --      Diastolic BP Percentile --      Pulse Rate 02/18/23 0943 (!) 105     Resp 02/18/23 0943 17     Temp 02/18/23 0943 (!) 97.4 F (36.3 C)     Temp Source 02/18/23 0943 Oral     SpO2 02/18/23 0943 97 %     Weight 02/18/23 0941 179 lb 14.3 oz (81.6 kg)     Height 02/18/23 0941 5\' 5"  (1.651 m)     Head Circumference --      Peak Flow --      Pain Score 02/18/23 0941 4     Pain Loc --      Pain Education --      Exclude from Growth Chart --     Most recent vital signs: Vitals:   02/18/23 1030 02/18/23 1331  BP:  107/82  Pulse:  87  Resp:  15  Temp:    SpO2: 100% 99%     Constitutional: Alert  Eyes: Conjunctivae are normal.  Head: Atraumatic. Nose: No congestion/rhinnorhea. Mouth/Throat: Mucous membranes are moist.   Neck: Painless ROM.  Cardiovascular:   Good peripheral circulation. Respiratory: Normal respiratory effort.  No retractions.  Gastrointestinal: Soft and nontender.  Musculoskeletal:  no deformity Neurologic:  MAE spontaneously. No gross focal neurologic deficits are appreciated.  Skin:  Skin is warm, dry and intact. No rash noted. Psychiatric: Mood and affect are normal. Speech and behavior are normal.    ED Results /  Procedures / Treatments   Labs (all labs ordered are listed, but only abnormal results are displayed) Labs Reviewed  BASIC METABOLIC PANEL - Abnormal; Notable for the following components:      Result Value   Sodium 133 (*)    Glucose, Bld 184 (*)    BUN 33 (*)    Creatinine, Ser 1.16 (*)    GFR, Estimated 52 (*)    All other components within normal limits  D-DIMER, QUANTITATIVE - Abnormal; Notable for the following components:   D-Dimer, Quant 1.28 (*)    All other components within normal limits  TSH  CBC  TROPONIN I (HIGH SENSITIVITY)  TROPONIN I (HIGH SENSITIVITY)     EKG  ED ECG REPORT I, Willy Eddy, the attending physician, personally viewed and interpreted this ECG.   Date: 02/18/2023  EKG Time: 9:42  Rate: 100  Rhythm: sinus  Axis: normal  Intervals: normal  ST&T Change: no stemi, no depressions    RADIOLOGY Please see ED Course for my review and interpretation.  I personally reviewed all radiographic images ordered to evaluate for the above acute complaints and  reviewed radiology reports and findings.  These findings were personally discussed with the patient.  Please see medical record for radiology report.    PROCEDURES:  Critical Care performed: No  Procedures   MEDICATIONS ORDERED IN ED: Medications  iohexol (OMNIPAQUE) 350 MG/ML injection 75 mL (75 mLs Intravenous Contrast Given 02/18/23 1358)     IMPRESSION / MDM / ASSESSMENT AND PLAN / ED COURSE  I reviewed the triage vital signs and the nursing notes.                              Differential diagnosis includes, but is not limited to, dysrhythmia, electrolyte abnormality, adjustment disorder, stress, anxiety, ACS, CHF, PE  Patient presenting to the ER for evaluation of symptoms as described above.  Based on symptoms, risk factors and considered above differential, this presenting complaint could reflect a potentially life-threatening illness therefore the patient will be placed  on continuous pulse oximetry and telemetry for monitoring.  Laboratory evaluation will be sent to evaluate for the above complaints.      Clinical Course as of 02/18/23 1518  Fri Feb 18, 2023  1208 Opponent normal but D-dimer is elevated.  Will order CTA to further evaluate. [PR]  1434 Reassessed.  Remains well-appearing no acute distress.  Blood work otherwise reassuring troponins negative.  CTA on my review and interpretation does not show any evidence of large saddle PE will await formal radiology report.  If negative I do anticipate patient will be appropriate for outpatient follow-up. [PR]  1514 CTA with suboptimal pulmonary artery opacification but given her lack of hypoxia persistent tachycardia or symptoms I think PE is exceedingly unlikely at this time.  I do not feel that anticoagulation clinically indicated and that she would be appropriate for outpatient follow-up. [PR]    Clinical Course User Index [PR] Willy Eddy, MD     FINAL CLINICAL IMPRESSION(S) / ED DIAGNOSES   Final diagnoses:  Atypical chest pain     Rx / DC Orders   ED Discharge Orders          Ordered    hydrOXYzine (ATARAX) 10 MG tablet  Every 8 hours PRN        02/18/23 1501             Note:  This document was prepared using Dragon voice recognition software and may include unintentional dictation errors.    Willy Eddy, MD 02/18/23 1503    Willy Eddy, MD 02/18/23 [phone]

## 2023-02-18 NOTE — ED Notes (Signed)
Pt given remote. NAD. This nurse attempted a IV. Pt denies any more iv attempts to be made. Pt agreed to be straight stuck for labs.

## 2023-07-08 ENCOUNTER — Emergency Department
Admission: EM | Admit: 2023-07-08 | Discharge: 2023-07-08 | Disposition: A | Discharge status: Home / Self Care | Attending: Emergency Medicine | Admitting: Emergency Medicine

## 2023-07-08 ENCOUNTER — Encounter: Payer: Self-pay | Admitting: Emergency Medicine

## 2023-07-08 ENCOUNTER — Other Ambulatory Visit: Payer: Self-pay

## 2023-07-08 DIAGNOSIS — E860 Dehydration: Secondary | ICD-10-CM

## 2023-07-08 DIAGNOSIS — R42 Dizziness and giddiness: Secondary | ICD-10-CM

## 2023-07-08 LAB — CBC
HCT: 43.3 % (ref 36.0–46.0)
Hemoglobin: 14.4 g/dL (ref 12.0–15.0)
MCH: 30.9 pg (ref 26.0–34.0)
MCHC: 33.3 g/dL (ref 30.0–36.0)
MCV: 92.9 fL (ref 80.0–100.0)
Platelets: 481 10*3/uL — ABNORMAL HIGH (ref 150–400)
RBC: 4.66 MIL/uL (ref 3.87–5.11)
RDW: 13.7 % (ref 11.5–15.5)
WBC: 15 10*3/uL — ABNORMAL HIGH (ref 4.0–10.5)
nRBC: 0 % (ref 0.0–0.2)

## 2023-07-08 LAB — BASIC METABOLIC PANEL WITH GFR
Anion gap: 11 (ref 5–15)
BUN: 31 mg/dL — ABNORMAL HIGH (ref 8–23)
CO2: 23 mmol/L (ref 22–32)
Calcium: 8.9 mg/dL (ref 8.9–10.3)
Chloride: 103 mmol/L (ref 98–111)
Creatinine, Ser: 1.9 mg/dL — ABNORMAL HIGH (ref 0.44–1.00)
GFR, Estimated: 28 mL/min — ABNORMAL LOW (ref >60)
Glucose, Bld: 182 mg/dL — ABNORMAL HIGH (ref 70–99)
Potassium: 3.1 mmol/L — ABNORMAL LOW (ref 3.5–5.1)
Sodium: 137 mmol/L (ref 135–145)

## 2023-07-08 MED ORDER — MECLIZINE HCL 25 MG PO TABS
25.0000 mg | ORAL_TABLET | Freq: Once | ORAL | Status: AC
  Administered 2023-07-08: 25 mg via ORAL
  Filled 2023-07-08: qty 1

## 2023-07-08 MED ORDER — SODIUM CHLORIDE 0.9 % IV BOLUS
1000.0000 mL | Freq: Once | INTRAVENOUS | Status: AC
  Administered 2023-07-08: 1000 mL via INTRAVENOUS

## 2023-07-08 MED ORDER — MECLIZINE HCL 25 MG PO TABS: 25.0000 mg | ORAL_TABLET | Freq: Three times a day (TID) | ORAL | 0 refills | Status: AC | PRN

## 2023-07-08 NOTE — ED Triage Notes (Signed)
 Pt via POV from home. Pt c/o dizziness and headache for the past 5-6 days. Reports she recently started on Ozympeic. Denies any head injury. Reports the dizziness is worse when she moves. Pt is A&Ox4 and NAD

## 2023-07-08 NOTE — ED Provider Notes (Signed)
 [facility] Provider Note    Event Date/Time   First MD Initiated Contact with Patient 07/08/23 1525     (approximate)   History   Dizziness   HPI  Julie Young is a 68 y.o. female who presents to the emergency department today because of concerns for dizziness.  Symptoms have been ongoing for the past roughly 5 to 6 days.  They did start after the patient had gotten an Ozempic injection.  She feels like the Ozempic might be playing a role.  The patient states that the dizziness is worse when she is up and moving around and goes away when she sits down.  It is a sensation of lightheadedness as well as some room spinning.  The patient states that she has a history of vertigo in the past but never this bad.  She denies any nausea or vomiting.  Denies any diarrhea.  States that her appetite has been decreased since the Ozempic.     Physical Exam   Triage Vital Signs: ED Triage Vitals  Encounter Vitals Group     BP 07/08/23 1444 116/83     Systolic BP Percentile --      Diastolic BP Percentile --      Pulse Rate 07/08/23 1444 (!) 101     Resp 07/08/23 1444 20     Temp --      Temp src --      SpO2 07/08/23 1444 99 %     Weight 07/08/23 1441 180 lb (81.6 kg)     Height 07/08/23 1441 5\' 4"  (1.626 m)     Head Circumference --      Peak Flow --      Pain Score 07/08/23 1441 0     Pain Loc --      Pain Education --      Exclude from Growth Chart --     Most recent vital signs: Vitals:   07/08/23 1444  BP: 116/83  Pulse: (!) 101  Resp: 20  SpO2: 99%   General: Awake, alert, oriented. CV:  Good peripheral perfusion. Tachycardia. Resp:  Normal effort. Lungs clear. Abd:  No distention. Non tender. Neuro:  PERRL. EOMI. Strength 5/5 in upper and lower extremities. Sensation grossly intact.   ED Results / Procedures / Treatments   Labs (all labs ordered are listed, but only abnormal results are displayed) Labs Reviewed  BASIC METABOLIC PANEL  WITH GFR - Abnormal; Notable for the following components:      Result Value   Potassium 3.1 (*)    Glucose, Bld 182 (*)    BUN 31 (*)    Creatinine, Ser 1.90 (*)    GFR, Estimated 28 (*)    All other components within normal limits  CBC - Abnormal; Notable for the following components:   WBC 15.0 (*)    Platelets 481 (*)    All other components within normal limits  URINALYSIS, ROUTINE W REFLEX MICROSCOPIC  CBG MONITORING, ED     EKG  I, Phineas Semen, attending physician, personally viewed and interpreted this EKG  EKG Time: 1450 Rate: 93 Rhythm: normal sinus rhythm Axis: normal Intervals: qtc 455 QRS: narrow, q waves v1 ST changes: no st elevation Impression: abnormal ekg  RADIOLOGY None   PROCEDURES:  Critical Care performed: N    MEDICATIONS ORDERED IN ED: Medications - No data to display   IMPRESSION / MDM / ASSESSMENT AND PLAN / ED COURSE  I reviewed the  triage vital signs and the nursing notes.                              Differential diagnosis includes, but is not limited to, vertigo, electrolyte abnormality, arrhythmia, CVA  Patient's presentation is most consistent with acute presentation with potential threat to life or bodily function.   Patient presents to the emergency department today because of concerns for dizziness.  No focal neurodeficits on exam.  Given patient's description it does sound like vertigo.  Patient has a history of same.  Blood work here is concerning for slight AKI.  Will give IV fluids.  Will give meclizine.  Will reassess.  Patient did feel better after IV fluids and meclizine.  She was able to ambulate with just very slight dizziness.  Given significant improvement in symptoms and reassuring workup I think it is reasonable for patient to be discharged home at this time.  Will give patient prescription for meclizine as well as ENT follow-up information.     FINAL CLINICAL IMPRESSION(S) / ED DIAGNOSES   Final  diagnoses:  Dizziness  Dehydration      Note:  This document was prepared using Dragon voice recognition software and may include unintentional dictation errors.    Phineas Semen, MD 07/08/23 2212
